# Patient Record
Sex: Female | Born: 1950 | Race: White | Hispanic: No | Smoking: Former smoker
Health system: Southern US, Community
[De-identification: ages and names within clinical notes are randomized; demographics above are authoritative.]

---

## 1997-09-20 DIAGNOSIS — Z923 Personal history of irradiation: Secondary | ICD-10-CM

## 1997-09-20 DIAGNOSIS — C50919 Malignant neoplasm of unspecified site of unspecified female breast: Secondary | ICD-10-CM

## 1997-09-20 HISTORY — DX: Malignant neoplasm of unspecified site of unspecified female breast: C50.919

## 1997-09-20 HISTORY — DX: Personal history of irradiation: Z92.3

## 1998-01-02 ENCOUNTER — Encounter: Admission: RE | Admit: 1998-01-02 | Discharge: 1998-01-02 | Payer: Self-pay | Admitting: Infectious Diseases

## 1998-05-27 ENCOUNTER — Ambulatory Visit (HOSPITAL_BASED_OUTPATIENT_CLINIC_OR_DEPARTMENT_OTHER): Admission: RE | Admit: 1998-05-27 | Discharge: 1998-05-27 | Payer: Self-pay

## 1998-05-29 ENCOUNTER — Encounter: Admission: RE | Admit: 1998-05-29 | Discharge: 1998-08-27 | Payer: Self-pay | Admitting: Radiation Oncology

## 1998-06-06 ENCOUNTER — Ambulatory Visit (HOSPITAL_COMMUNITY): Admission: RE | Admit: 1998-06-06 | Discharge: 1998-06-06 | Payer: Self-pay

## 1998-08-20 ENCOUNTER — Encounter: Admission: RE | Admit: 1998-08-20 | Discharge: 1998-11-18 | Payer: Self-pay | Admitting: Radiation Oncology

## 1999-01-08 ENCOUNTER — Other Ambulatory Visit: Admission: RE | Admit: 1999-01-08 | Discharge: 1999-01-08 | Payer: Self-pay | Admitting: Gynecology

## 2000-04-14 ENCOUNTER — Other Ambulatory Visit: Admission: RE | Admit: 2000-04-14 | Discharge: 2000-04-14 | Payer: Self-pay | Admitting: Gynecology

## 2000-08-04 ENCOUNTER — Other Ambulatory Visit: Admission: RE | Admit: 2000-08-04 | Discharge: 2000-08-04 | Payer: Self-pay | Admitting: Gynecology

## 2000-08-04 ENCOUNTER — Encounter (INDEPENDENT_AMBULATORY_CARE_PROVIDER_SITE_OTHER): Payer: Self-pay | Admitting: Specialist

## 2001-01-10 ENCOUNTER — Encounter: Payer: Self-pay | Admitting: Cardiology

## 2001-01-10 ENCOUNTER — Inpatient Hospital Stay (HOSPITAL_COMMUNITY): Admission: EM | Admit: 2001-01-10 | Discharge: 2001-01-12 | Payer: Self-pay | Admitting: Emergency Medicine

## 2001-04-06 ENCOUNTER — Encounter: Admission: RE | Admit: 2001-04-06 | Discharge: 2001-04-06 | Payer: Self-pay | Admitting: Surgery

## 2001-04-06 ENCOUNTER — Encounter: Payer: Self-pay | Admitting: Surgery

## 2003-06-06 ENCOUNTER — Other Ambulatory Visit: Admission: RE | Admit: 2003-06-06 | Discharge: 2003-06-06 | Payer: Self-pay | Admitting: Gynecology

## 2004-02-28 ENCOUNTER — Encounter: Admission: RE | Admit: 2004-02-28 | Discharge: 2004-02-28 | Payer: Self-pay | Admitting: Surgery

## 2004-06-25 ENCOUNTER — Encounter: Admission: RE | Admit: 2004-06-25 | Discharge: 2004-06-25 | Payer: Self-pay | Admitting: Gastroenterology

## 2004-06-26 ENCOUNTER — Encounter: Admission: RE | Admit: 2004-06-26 | Discharge: 2004-06-26 | Payer: Self-pay | Admitting: Gastroenterology

## 2004-06-29 ENCOUNTER — Ambulatory Visit (HOSPITAL_COMMUNITY): Admission: RE | Admit: 2004-06-29 | Discharge: 2004-06-29 | Payer: Self-pay | Admitting: Gastroenterology

## 2004-12-31 ENCOUNTER — Other Ambulatory Visit: Admission: RE | Admit: 2004-12-31 | Discharge: 2004-12-31 | Payer: Self-pay | Admitting: Gynecology

## 2006-03-08 ENCOUNTER — Encounter: Admission: RE | Admit: 2006-03-08 | Discharge: 2006-03-08 | Payer: Self-pay | Admitting: Gynecology

## 2007-01-26 ENCOUNTER — Observation Stay (HOSPITAL_COMMUNITY): Admission: EM | Admit: 2007-01-26 | Discharge: 2007-01-28 | Payer: Self-pay | Admitting: Emergency Medicine

## 2007-01-26 ENCOUNTER — Encounter (INDEPENDENT_AMBULATORY_CARE_PROVIDER_SITE_OTHER): Payer: Self-pay | Admitting: Specialist

## 2007-04-18 ENCOUNTER — Encounter: Admission: RE | Admit: 2007-04-18 | Discharge: 2007-04-18 | Payer: Self-pay | Admitting: Surgery

## 2007-04-19 ENCOUNTER — Ambulatory Visit (HOSPITAL_COMMUNITY): Admission: RE | Admit: 2007-04-19 | Discharge: 2007-04-19 | Payer: Self-pay | Admitting: Gastroenterology

## 2007-05-30 ENCOUNTER — Ambulatory Visit (HOSPITAL_BASED_OUTPATIENT_CLINIC_OR_DEPARTMENT_OTHER): Admission: RE | Admit: 2007-05-30 | Discharge: 2007-05-30 | Payer: Self-pay | Admitting: Surgery

## 2007-05-30 ENCOUNTER — Encounter (INDEPENDENT_AMBULATORY_CARE_PROVIDER_SITE_OTHER): Payer: Self-pay | Admitting: Surgery

## 2008-07-04 ENCOUNTER — Encounter: Admission: RE | Admit: 2008-07-04 | Discharge: 2008-07-04 | Payer: Self-pay | Admitting: Surgery

## 2009-07-08 ENCOUNTER — Encounter: Admission: RE | Admit: 2009-07-08 | Discharge: 2009-07-08 | Payer: Self-pay | Admitting: Gynecology

## 2011-02-02 NOTE — H&P (Signed)
NAMEBILAN, TEDESCO NO.:  [id number]   MEDICAL RECORD NO.:  [id number]          PATIENT TYPE:  EMS   LOCATION:  MAJO                         FACILITY:  [facility]   PHYSICIAN:  Adolph Pollack, M.D.DATE OF BIRTH:  1951-02-24   DATE OF ADMISSION:  01/26/2007  DATE OF DISCHARGE:                              HISTORY & PHYSICAL   CHIEF COMPLAINT:  Abdominal pain.   HISTORY OF PRESENT ILLNESS:  Ms. Sibrian is a 60 year old female who after  supper about 7 o'clock last night developed some significant what she  describes as some sharp centralized abdominal pain that progressively  worsened.  It eventually migrated to the right lower quadrant.  She  states she had been treated for a urinary tract infection about the past  day and a half.  She presented to the emergency department at 1 o'clock  this morning for evaluation.  She was examined at that time, and then a  CT scan was ordered.  This demonstrated appendicitis with some tiny  locules of free air which suggests perforation.  Some hepatic cysts were  noted.  At this time, I was asked to see her.  She says she has had some  fever and nausea.   PAST MEDICAL HISTORY:  1. Right breast cancer, status post breast conservation radiation      treatment.  2. Coronary spasm.  3. Hypertension.  4. UTI.  5. Positive PPD.   PREVIOUS OPERATIONS:  1. Right partial mastectomy and a sentinel lymph node biopsy.  2. Left femoral hernia repair.   ALLERGIES:  None.   MEDICATIONS:  1. Norvasc.  2. Septra.   SOCIAL HISTORY:  She is a former smoker; she quit about 30 years ago.  No alcohol use.  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   REVIEW OF SYSTEMS:  CARDIAC:  Other than the coronary spasm, she denies  any cardiac disease.  PULMONARY:  No asthma, COPD, tuberculosis.  GI: No  hepatitis, peptic ulcer disease, diverticulitis.  GU:  No kidney stones.  ENDOCRINE:  No diabetes, thyroid  disease, or hypercholesterolemia.  NEUROLOGIC:  No strokes or seizures.  HEMATOLOGIC:  No bleeding  disorders, transfusions or blood clots.   PHYSICAL EXAMINATION:  GENERAL:  A slightly ill-appearing female, but in  no acute distress at this time.  VITAL SIGNS:  Temperature is 0.1, blood pressure is 114/62, pulse 88.  HEENT:  Eyes - Extraocular motions intact.  No icterus.  NECK:  Supple without obvious masses.  RESPIRATORY:  Breath sounds are equal and clear.  Respirations are  unlabored.  CARDIOVASCULAR:  Regular rate and regular rhythm.  I do not hear a  murmur.  No JVD or lower extremity edema.  ABDOMEN:  Soft.  There is right lower quadrant tenderness and guarding.  Negative Rovsing's sign.  Positive obturator sign.  No palpable masses.  GU:  There is a left groin scar present.  EXTREMITIES:  Good muscle tone.  No edema.  SKIN:  No jaundice.  NEUROLOGIC:  Alert and oriented, answers questions appropriately.  LABORATORY DATA:  White blood cell count 12,300, hemoglobin 12.2.  Electrolytes within normal limits except for a potassium of 3.4.  CT  scan was reviewed.  This demonstrates a dilated appendix with some  periappendiceal inflammatory change.  A small amount of free fluid in  the pelvis.  There are some tiny locules of intraperitoneal air noted.   IMPRESSION:  Acute appendicitis with probable perforation.   PLAN:  IV antibiotics.  Laparoscopic, possible open appendectomy.  I  have discussed the procedure and the risks with her.  Risks include but  are not limited to bleeding, infection, wound healing problems,  anesthesia, accidental damage to abdominal organs, and prolonged  hospital course.  She seems to understand this and agrees to proceed.      Adolph Pollack, M.D.  Electronically Signed     TJR/MEDQ  D:  01/26/2007  T:  01/26/2007  Job:  [id number]

## 2011-02-02 NOTE — Op Note (Signed)
Deanna Reeves, Deanna Reeves                ACCOUNT NO.:  [id number]   MEDICAL RECORD NO.:  [id number]          PATIENT TYPE:  AMB   LOCATION:  DSC                          FACILITY:  [facility]   PHYSICIAN:  Currie Paris, M.D.DATE OF BIRTH:  [birth year]   DATE OF PROCEDURE:  05/30/2007  DATE OF DISCHARGE:                               OPERATIVE REPORT   OFFICE MEDICAL RECORD NUMBER [id number].   PREOPERATIVE DIAGNOSIS:  Tiny nodule, left breast, probably dermal.   POSTOPERATIVE DIAGNOSIS:  Tiny nodule, left breast, probably dermal.   OPERATION:  Removal, left breast dermal mass.   SURGEON:  Currie Paris, M.D.   ANESTHESIA:  Local.   CLINICAL HISTORY:  Ms. Castor has had a history of a right breast cancer  and has a small mass in the left breast that feels dermal or just subcu  that is about 2 mm in size.  With her given prior history, we elected to  remove this.   DESCRIPTION OF PROCEDURE:  The patient was seen in the minor procedure  room and we confirmed the plans for the procedure and identified the  tiny nodule, and I was able to make a small ink mark directly on the  nodule.   The area was then anesthetized with 1% Xylocaine with epinephrine.  I  waited approximately 10 minutes for good effect of the epinephrine.  The  area was prepped with Betadine.  I made an elliptical incision taking  off a small skin dot as well as the tiny nodule, which seemed attached  to the undersurface of the skin although I could not see a punctum on  the surface.   There was minimal bleeding.  The incision was closed with a 4-0 Monocryl  subcuticular and Dermabond.  The patient tolerated the procedure well  and there no complications.      Currie Paris, M.D.  Electronically Signed     CJS/MEDQ  D:  05/30/2007  T:  05/30/2007  Job:  [id number]

## 2011-02-02 NOTE — Op Note (Signed)
NAMEVIANCA, Deanna Reeves                ACCOUNT NO.:  [id number]   MEDICAL RECORD NO.:  [id number]          PATIENT TYPE:  INP   LOCATION:  2550                         FACILITY:  [facility]   PHYSICIAN:  Leonie Man, M.D.   DATE OF BIRTH:  [birth year]   DATE OF PROCEDURE:  01/26/2007  DATE OF DISCHARGE:                               OPERATIVE REPORT   PREOPERATIVE DIAGNOSIS:  Acute appendicitis.   POSTOPERATIVE DIAGNOSIS:  Acute appendicitis.   PROCEDURE:  Laparoscopic appendectomy.   SURGEON:  Leonie Man, M.D.   ASSISTANT:  OR tech.   ANESTHESIA:  General.   SPECIMENS TO LABORATORY:  Appendix.   ESTIMATED BLOOD LOSS:  Minimal.   COMPLICATIONS:  None.  The patient returned to the PACU in excellent  condition.   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  The pain worsened and migrated to the right lower quadrant.  She comes  in without a h/o fever, nausea or vomiting.  CT scan of the abdomen  demonstrates acute appendicitis.   The patient is brought to the operating room after the risks and  potential benefits of surgery have been discussed with her, all  questions answered and her consent is obtained for surgery.   DESCRIPTION OF PROCEDURE:  Following induction of satisfactory general  anesthesia, the patient was positioned supinely.  The abdomen was  prepped and draped to be included in a sterile operative field.  Open  laparoscopy entry was created at the umbilicus and a Hasson cannula was  inserted.  The peritoneum was insufflated to 15 mmHg pressure.  The  visualized appendix was partially retrocecal and had to be dissected up  from the retrocecal area. A 5-mm trocar was placed at the suprapubic  area and a 10/12-mm trocar was placed in the left lower quadrant.  The  appendix was grasped and dissected from its retrocecal position. The  harmonic scalpel was then used  to divide the mesoappendix. The  dissection was continued to base the appendix.  The appendix was  transected with an Endo-GIA stapling device..  The appendix was then  placed in an Endopouch and retrieved through the left lower quadrant  wound without difficulty.  The right lower quadrant was then thoroughly  irrigated with multiple aliquots of normal saline. The cecal suture line  was inspected for hemostasis and noted to be dry.  The suture line  appeared to be intact.  The saline was aspirated out.  The  pneumoperitoneum was allowed to deflate after the trocars were removed  under direct vision and the wounds closed in layers as follows:  The  umbilical wound in 2 layers with 0  Vicryl and 4-0 Monocryl; the left lower quadrant and suprapubic wounds  were closed with 4-0 Monocryl sutures.  All wounds were reinforced with  Steri-Strips.  Sterile dressings were applied, the anesthetic was  reversed and the patient removed from the operating room to the recovery  room in stable condition.  She tolerated the procedure well.  Leonie Man, M.D.  Electronically Signed     PB/MEDQ  D:  01/26/2007  T:  01/26/2007  Job:  [id number]

## 2011-02-05 NOTE — Cardiovascular Report (Signed)
[place]  Patient:    Deanna Reeves, Deanna Reeves                       MRN: [id number] Proc. Date: 01/10/01 Adm. Date:  [id number] Attending:  Ophelia Shoulder CC:         Cardiac Catheterization Laboratory  [facility], c/o Dr. Elsie Lincoln   Cardiac Catheterization  PROCEDURE PERFORMED:  Emergency combined left heart catheterization.  INDICATIONS:  Acute chest pain and ST segment elevation showing inferior and anterolateral wall injury pattern.  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  It occurred at rest.  She visited the Urgent Care Center in [place] and there had ST segment elevations in the inferior and anterolateral leads.  She was brought to the [facility] Emergency Room by [facility] and en route received sublingual nitroglycerin twice. When she arrived here, a repeat ECG showed complete resolution of the previously seen ECG, ST elevations.  She was pain-free.  Vital signs were stable.  Labs were drawn and she was brought to the catheterization lab emergently.  The patient has no knowledge of any heart disease in the past.  Risk factors are only related to mild labile elevated blood pressure.  RESULTS:  PRESSURES:  The left ventricular pressure was 127/17, the central aortic pressure 125/75, mean of 100.  No aortic valve gradient by pullback technique.  The left main coronary artery was large and angiographically patent.  The LAD coursed to the cardiac apex giving rise to three diagonal branches and three septal perforator branches.  After the third diagonal and third septal perforator, there was a smooth irregularity in the LAD, which amounted to no more than 20% of luminal  reduction of the surrounding LAD.  The diagonal branches were normal.  The circumflex coronary artery was nondominant but was very large and basically terminated as a posterolateral branch.  Just after the takeoff of a small to medium sized ramus intermedius, the circumflex showed eccentric kinking in some views.  However, no obstructive lesion was seen there. The right coronary artery was large and dominant.  No lesions were seen. There was streaming of dye.  No thrombus was noted.  The left ventricle contracted vigorously and normally.  Her calculated ejection fraction was 68%.  I did not see any obvious mitral prolapse and there was no mitral regurgitation.  There was also no LV thrombus.  ABDOMINAL AORTOGRAM:  Abdominal showed normal renal arteries and abdominal aorta and common iliacs.  FINAL DIAGNOSES: 1. Angiographically patent coronary arteries. 2. Clinical suspicion of reversible irregularities and proximal circumflex    which probably constitutes spasm. 3. Normal left ventricular systolic function.  DISCUSSION:  The patients clinical course is that of coronary spasm.  I am suspicious that it is related to a vasospastic process involving the proximal circumflex.  The patient will be continued on IV nitroglycerin, oral platelet inhibitors and oral calcium channel blocker in the form of Norvasc.  We will weigh her cardiac enzymes. DD:  01/10/01 TD:  01/11/01 Job: [id number]

## 2011-02-05 NOTE — Discharge Summary (Signed)
[place]  Patient:    Deanna Reeves, Deanna Reeves                       MRN: [id number] Adm. Date:  [id number] Disc. Date: [id number] Attending:  Ophelia Shoulder Dictator:   [facility] [place], [place].A.                           Discharge Summary  ADMITTING DIAGNOSES: 1. Unstable angina, questionable spasm versus coronary artery disease    etiology. 2. Hypertension. 3. History of breast cancer with lumpectomy and radiation therapy.  DISCHARGE DIAGNOSES: 1. Acute coronary vasospasm. 2. Hypertension. 3. Status post cardiac catheterization, January 10, 2001 by Dr. Lavonne Chick    revealing no significant coronary artery disease, but positive coronary    vasospasm. 4. History of breast cancer with lumpectomy and radiation therapy.  HISTORY OF PRESENT ILLNESS:  Deanna Reeves is a 60 year old white divorced white female with no prior cardiac history and past medical history only significant for mildly elevated blood pressures and history of breast cancer.  She presented to [facility] ER on January 10, 2001 with complaints of chest discomfort.  She developed significant upper chest burning earlier that day, but had never had this type of discomfort before.  She started feeling "bad in general," and took regular aspirin and went to the [facility]. There the EKG revealed ST elevation in 2, 3, aVF as well as V4-V6. She was given 1 nitroglycerin with improvement.  Second nitroglycerin in the EMS had brought complete resolution of the pain.  She had no associated symptoms.  At [facility] in the emergency room she was having no chest pain at that time and her EKG was improved; and, she was on IV heparin and nitroglycerin, and was also given Plavix and was taken to the cardiac cath lab emergently.  On exam in the ER her blood pressure was elevated at 160/100, heart rate was 74 and the remainder of her exam was benign.  EKG #1 had showed ST elevation in lead 2, 3,  aVF and V4-V6.  EKG #2 showed sinus rhythm in LAD.  At that poiny laboratories were pending as well as chest x-ray.  HOSPITAL COURSE:  On January 10, 2001 Deanna Reeves was taken to the cardiac catheterization lab emergently by Dr. Lavonne Chick.  While in the cath lab her EKG changed multiple times in leads 1, 2 and 3 with ST elevation transiently in lead 1 during cath.  T inversions in lead 3 reverting to baseline was within minutes.  She had mild chest pain recurrence in the lab.  Though her blood pressure had been elevated in the ER at 160/100 in the cath lab it was down to 130/76.  Cardiac catheterization was performed via the right groin. Her right coronary artery was dominant, but showed no lesions.  The LAD had some mid smooth 20-30% irregularity.  The left circumflex had proximal eccentric spasm.  Her LV was normal.  Abdominal aorta and renals were normal.  With these cardiac cath findings Dr. Elsie Lincoln planned to await cardiac enzymes, check EKGs during any further pain.  He was planned Plavix, aspirin and was planned for IV nitroglycerin overnight, and for oral Norvasc therapy.  On January 11, 2001 Deanna Reeves remained stable.  Her cardiac enzymes were positive, but the third set was coming down.  Her CKs were 127, 339 and 258. MBs  were 8.6, 4.3 and 2.2.  Troponin was 0.25 and 8.62 at that point.  TSH was normal.  She remained afebrile and remained hemodynamically stable.  Her right groin was stable with no hematoma and she had a 3+ dorsalis pedis pulse.  Her chest pain was gone, but she continued to have a nitroglycerin induced headache.  At that point Dr. Elsie Lincoln planned to wean her off the IV nitroglycerin and continue Norvasc for blood pressure and spasm prophylaxis. He planned to monitor her for one more day and planned for discharge home the following morning if all went well.  On January 12, 2001 Deanna Reeves is subjectively well.  She continues to have a nitrate induced headache, but no  further chest discomfort.  Her pulse was 76 and blood pressure was stable.  Her lungs were clear.  Heart was in regular rhythm without murmur.  Her groin was stable with no hematoma.  She was seen by Dr. Lavonne Chick at this time who has deemed her stable for discharge home.  HOSPITAL CONSULTS:  None.  HOSPITAL PROCEDURES:  Cardiac catheterization on January 10, 2001 by Dr. Lavonne Chick.  This revealed RCA to be dominant, but with no lesions.  LAD had mid smooth 20-30% irregularity. Left circumflex had proximal eccentric spasm.  LV was normal.  Abdominal aorta and renals were normal.  HOSPITAL LABORATORIES:  First set of enzymes showed CK of 127, MB 8.6 and troponin 0.25.  Second set of cardiac enzymes showed CK 339, MB 43.6 and troponin 8.62.  Third set showed CK 258, MB 22.2.  Lipid profile revealed total cholesterol 124, triglycerides 51, HDL 58 and LDL 56.  On April 23rd sodium 134, potassium 4.0, glucose 110, BUN 11, and creatinine 0.7.  LFTs normal with total bili 0.8, alk phos 57, SGOT 25, SGPT 18.  CBC at that time showed WBC 8.4, hemoglobin 13.5, hematocrit 37.9 and platelets 248,000.  On admission PT 13.3, INR 1.1 and PTT 29.  TSH normal at 1.421.  On April 24th labs remained stable with sodium 138, potassium 3.4, which was repleted, glucose 80, BUN 8, and creatinine 0.8.  CBC at that time showed WBC 10.2, hemoglobin 12.6, hematocrit 36.3 and platelets 224,000.  Radiology:  Chest x-ray showed borderline cardiac size, no active chest disease radiographically.  EKG:  Upon arrival to the emergency room her initial EKG showed sinus tachycardia with sinus rhythm with ST elevations in leads 2, 3, aVF as well as V4 through V6.  Second EKG in the ER showed sinus rhythm, LAD.  While in the cath lab her EKG changed multiple times in leads 1, 2 and 3 with ST elevation transiently in lead 1 during cath.  T inversions in lead 3 reverting to baseline within minutes.  DISCHARGE  MEDICATIONS: 1. Enteric coated aspirin 325 mg once a day. 2. Plavix 75 mg once a day. 3. Norvasc 5 mg once a day. 4. Protonix 40 mg once a day.  5. Nitroglycerin 0.4 mg as directed.  ACTIVITIES:  No strenuous activity, lifting greater than five pounds, driving or sexual activity for three days.  DIET:  Low-salt, low-fat, low-cholesterol diet.  WOUND CARE:  Gently wash her groin with warm water and soap.  SPECIAL INSTRUCTIONS:  Call the office at [phone] if any bleeding or increased size or pain in the groin.   Out of work until May 06th.  Have blood drawn on May 06th, will check BMET.  FOLLOW-UP:  She is scheduled for an exercise Cardiolite  at our office Tuesday, April 30th at 8:15 A.M.  She has an appointment to follow up with Dr. Elsie Lincoln in the office Thursday, May 02nd, at 3:40 P.M. DD:  01/12/01 TD:  01/13/01 Job: [id number]

## 2011-02-05 NOTE — Op Note (Signed)
[name], [name]                ACCOUNT NO.:  [id number]   MEDICAL RECORD NO.:  [id number]          PATIENT TYPE:  AMB   LOCATION:  ENDO                         FACILITY:  [facility]   PHYSICIAN:  Anselmo Rod, M.D.  DATE OF BIRTH:  July 31, 1951   DATE OF PROCEDURE:  06/29/2004  DATE OF DISCHARGE:                                 OPERATIVE REPORT   PROCEDURE PERFORMED:  Colonoscopy with snare polypectomy times one.   ENDOSCOPIST:  Charna Elizabeth, M.D.   INSTRUMENT USED:  Olympus video colonoscope.   INDICATIONS FOR PROCEDURE:  The patient is a 60 year old white female with a  personal history of breast cancer undergoing a screening colonoscopy to rule  out colonic polyps, masses, etc.   PREPROCEDURE PREPARATION:  Informed consent was procured from the patient.  The patient was fasted for eight hours prior to the procedure and prepped  with a bottle of magnesium citrate and a gallon of GoLYTELY the night prior  to the procedure.   PREPROCEDURE PHYSICAL:  The patient had stable vital signs.  Neck supple.  Chest clear to auscultation.  S1 and S2 regular.  Abdomen soft with normal  bowel sounds.   DESCRIPTION OF PROCEDURE:  The patient was placed in left lateral decubitus  position and sedated with 60 mg of Demerol and 6 mg of Versed in slow  incremental doses.  Once the patient was adequately sedated and maintained  on low flow oxygen and continuous cardiac monitoring, the Olympus video  colonoscope was advanced from the rectum to the cecum with difficulty.  There was a large amount of residual stool in the colon.  Multiple washes  were done.  The appendicular orifice and ileocecal valve were clearly  visualized and photographed.  A small sessile polyp was snared from 20 cm  but was loss in stool.  No masses, polyps, erosions, ulcerations, etc. were  identified besides the one mentioned above.  Retroflexion in the rectum  revealed no abnormalities.  There was no evidence of  diverticulosis.  The  patient tolerated the procedure well without complication.  Small lesions  could have been missed.   IMPRESSION:  1.  Small sessile polyp snared from 20 cm.  Polyp loss in stool.  2.  No evidence of diverticulosis.  3.  No large masses or polyps seen.  4.  Large amount of residual stool in the colon, small lesions could have      been missed.   RECOMMENDATIONS:  1.  Continue high fiber diet with liberal fluid intake.  2.  Repeat colorectal cancer screening in the next three years unless the      patient develops any abnormal symptoms in the interim.  3.  Outpatient followup as need arises in the future.       JNM/MEDQ  D:  06/29/2004  T:  06/29/2004  Job:  [id number]   cc:   Madaline Savage, M.D.  [address]  [place]  [place] [address]  Fax: [phone]   Luvenia Redden, M.D.  [address]  [place]  [place] [address]  Fax: [phone]   Currie Paris, M.D.  [address], [address]  [place]  [place] [address]  Fax: [phone]

## 2019-10-12 ENCOUNTER — Ambulatory Visit: Payer: Medicare Other | Attending: Internal Medicine

## 2019-10-12 DIAGNOSIS — Z23 Encounter for immunization: Secondary | ICD-10-CM

## 2019-10-12 NOTE — Progress Notes (Signed)
   Covid-19 Vaccination Clinic  Name:  MUNEERA WEGENER    MRN: [id number] DOB: [birth year]  10/12/2019  Ms. Bisson was observed post Covid-19 immunization for 15 minutes without incidence. She was provided with Vaccine Information Sheet and instruction to access the V-Safe system.   Ms. Kinnison was instructed to call 911 with any severe reactions post vaccine: Marland Kitchen Difficulty breathing  . Swelling of your face and throat  . A fast heartbeat  . A bad rash all over your body  . Dizziness and weakness    Immunizations Administered    Name Date Dose VIS Date Route   Pfizer COVID-19 Vaccine 10/12/2019  5:22 PM 0.3 mL 08/31/2019 Intramuscular   Manufacturer: [place]   Lot: [id number]   [place]: [id number]

## 2019-11-02 ENCOUNTER — Ambulatory Visit: Payer: Federal, State, Local not specified - PPO | Attending: Internal Medicine

## 2019-11-02 DIAGNOSIS — Z23 Encounter for immunization: Secondary | ICD-10-CM | POA: Insufficient documentation

## 2019-11-02 NOTE — Progress Notes (Signed)
   Covid-19 Vaccination Clinic  Name:  Deanna Reeves    MRN: [id number] DOB: 08-06-1951  11/02/2019  Deanna Reeves was observed post Covid-19 immunization for 15 minutes without incidence. She was provided with Vaccine Information Sheet and instruction to access the V-Safe system.   Deanna Reeves was instructed to call 911 with any severe reactions post vaccine: Marland Kitchen Difficulty breathing  . Swelling of your face and throat  . A fast heartbeat  . A bad rash all over your body  . Dizziness and weakness    Immunizations Administered    Name Date Dose VIS Date Route   Pfizer COVID-19 Vaccine 11/02/2019  3:59 PM 0.3 mL 08/31/2019 Intramuscular   Manufacturer: [place]   Lot: [id number]   [place]: [id number]

## 2020-10-27 ENCOUNTER — Other Ambulatory Visit: Payer: Self-pay | Admitting: Family Medicine

## 2020-10-27 DIAGNOSIS — E2839 Other primary ovarian failure: Secondary | ICD-10-CM

## 2020-11-05 ENCOUNTER — Other Ambulatory Visit: Payer: Self-pay | Admitting: Family Medicine

## 2020-11-05 DIAGNOSIS — Z1231 Encounter for screening mammogram for malignant neoplasm of breast: Secondary | ICD-10-CM

## 2020-12-25 ENCOUNTER — Ambulatory Visit
Admission: RE | Admit: 2020-12-25 | Discharge: 2020-12-25 | Disposition: A | Discharge status: Home / Self Care | Payer: Medicare Other | Source: Ambulatory Visit | Attending: Family Medicine | Admitting: Family Medicine

## 2020-12-25 ENCOUNTER — Other Ambulatory Visit: Payer: Self-pay

## 2020-12-25 DIAGNOSIS — Z1231 Encounter for screening mammogram for malignant neoplasm of breast: Secondary | ICD-10-CM

## 2021-04-09 ENCOUNTER — Other Ambulatory Visit: Payer: Self-pay

## 2021-04-09 ENCOUNTER — Ambulatory Visit
Admission: RE | Admit: 2021-04-09 | Discharge: 2021-04-09 | Disposition: A | Discharge status: Home / Self Care | Payer: Medicare Other | Source: Ambulatory Visit | Attending: Family Medicine | Admitting: Family Medicine

## 2021-04-09 DIAGNOSIS — E2839 Other primary ovarian failure: Secondary | ICD-10-CM

## 2022-10-11 IMAGING — MG MM DIGITAL SCREENING BILAT W/ TOMO AND CAD
6 of 10 series · 6 of 30 positions shown · non-contrast
Comparison: Previous exam(s).

CLINICAL DATA: Screening.

EXAM:
DIGITAL SCREENING BILATERAL MAMMOGRAM WITH TOMOSYNTHESIS AND CAD
TECHNIQUE: Bilateral screening digital craniocaudal and mediolateral oblique
mammograms were obtained. Bilateral screening digital breast
tomosynthesis was performed. The images were evaluated with
computer-aided detection.

[L CC synth-2D]
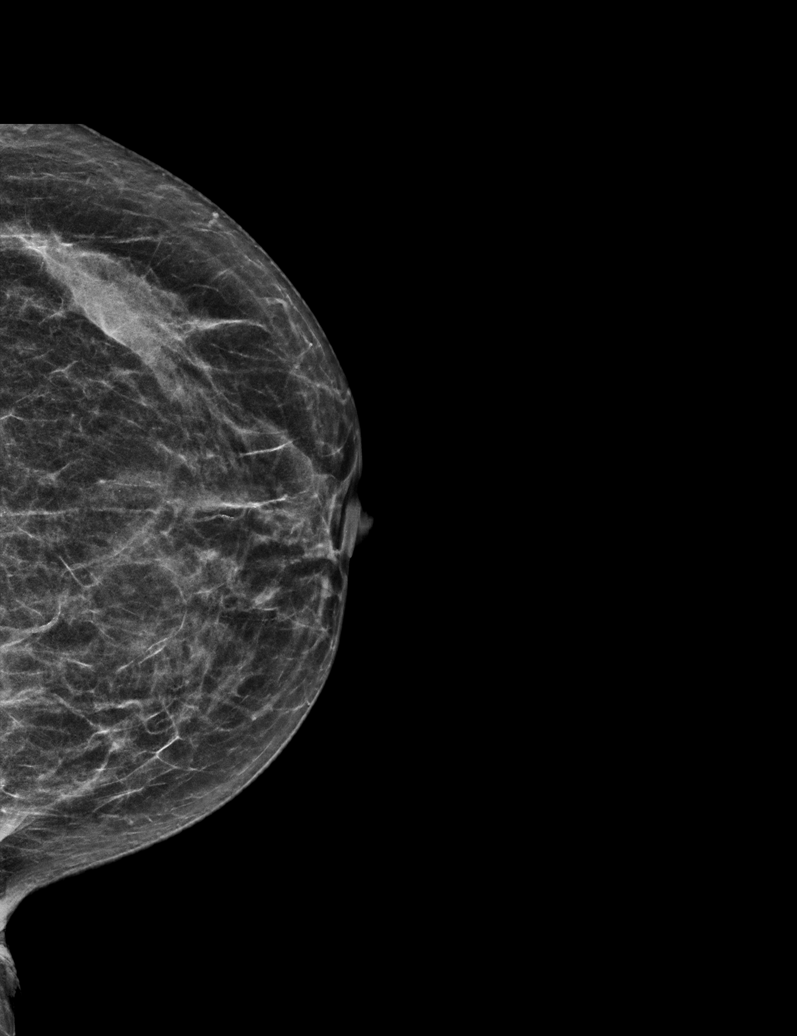

[L MLO synth-2D]
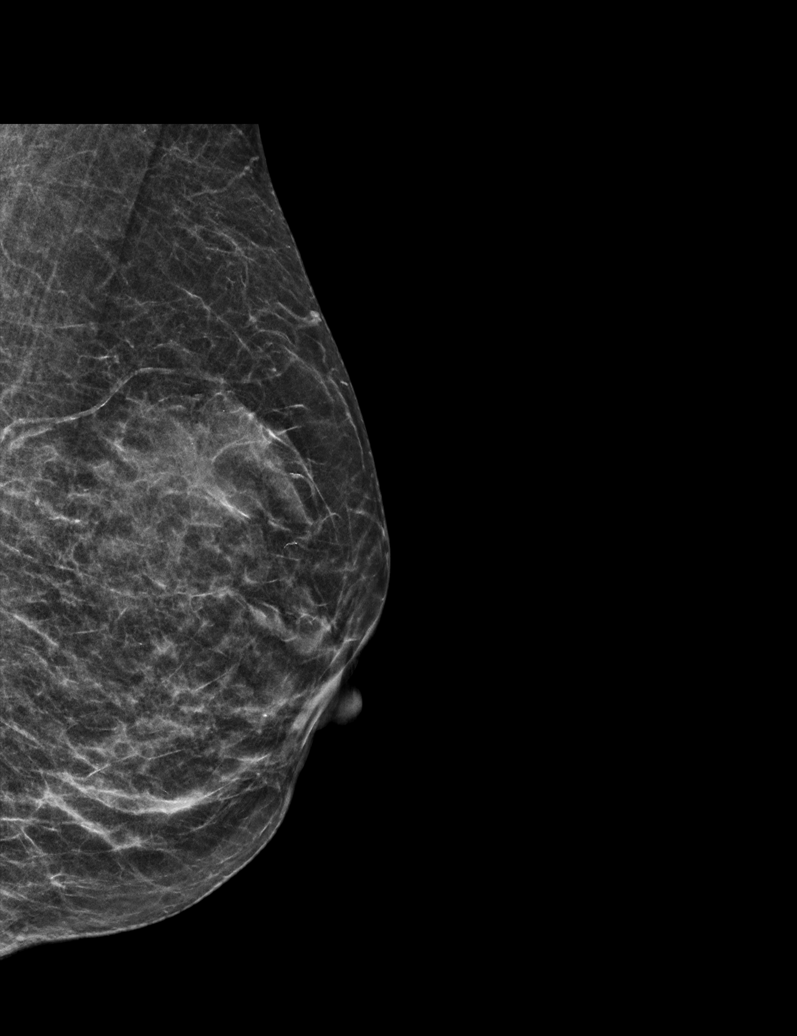

[R MLO synth-2D]
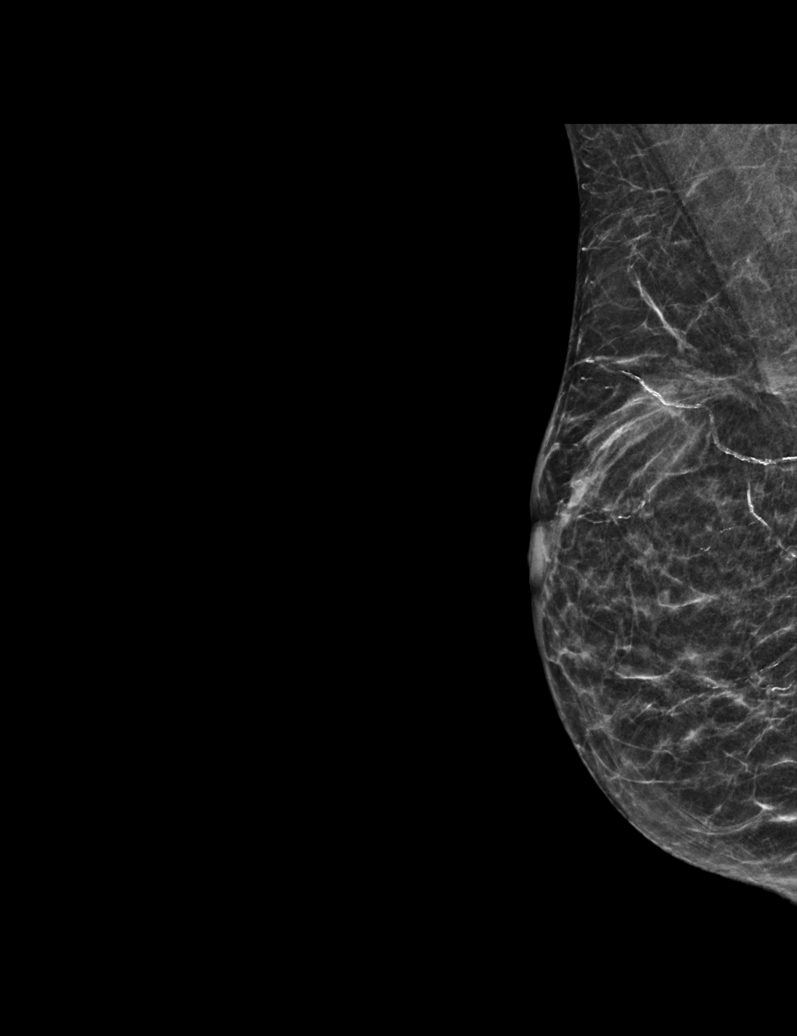

[R XCCL synth-2D]
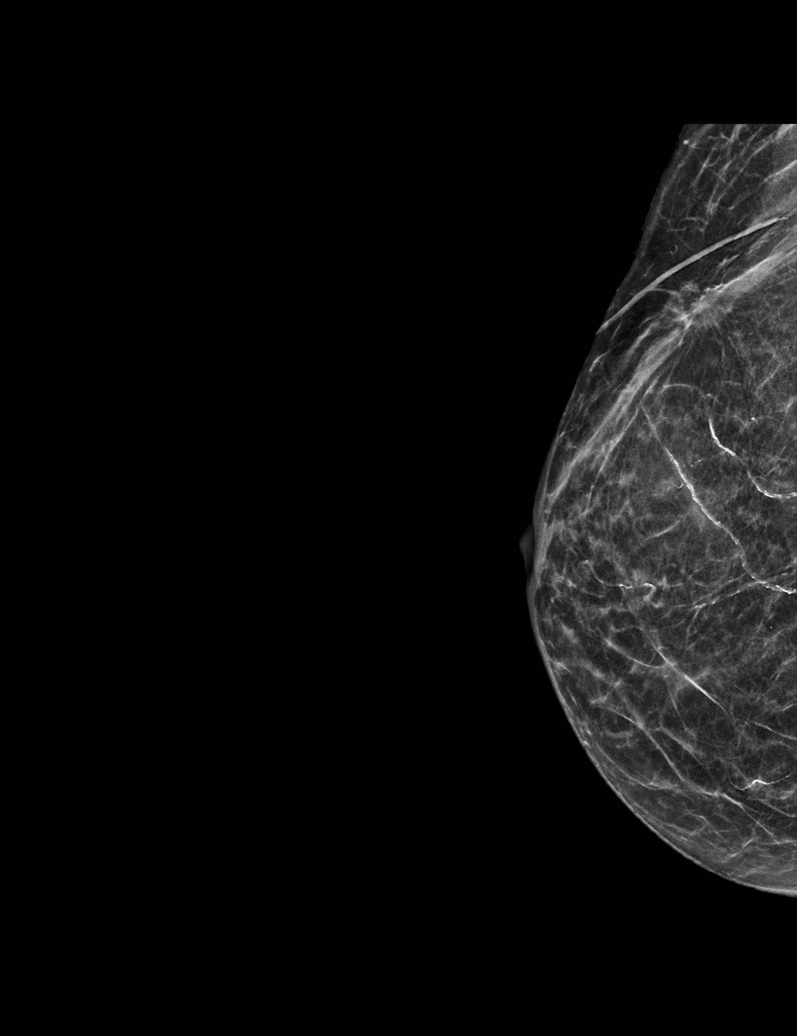

[R CC synth-2D]
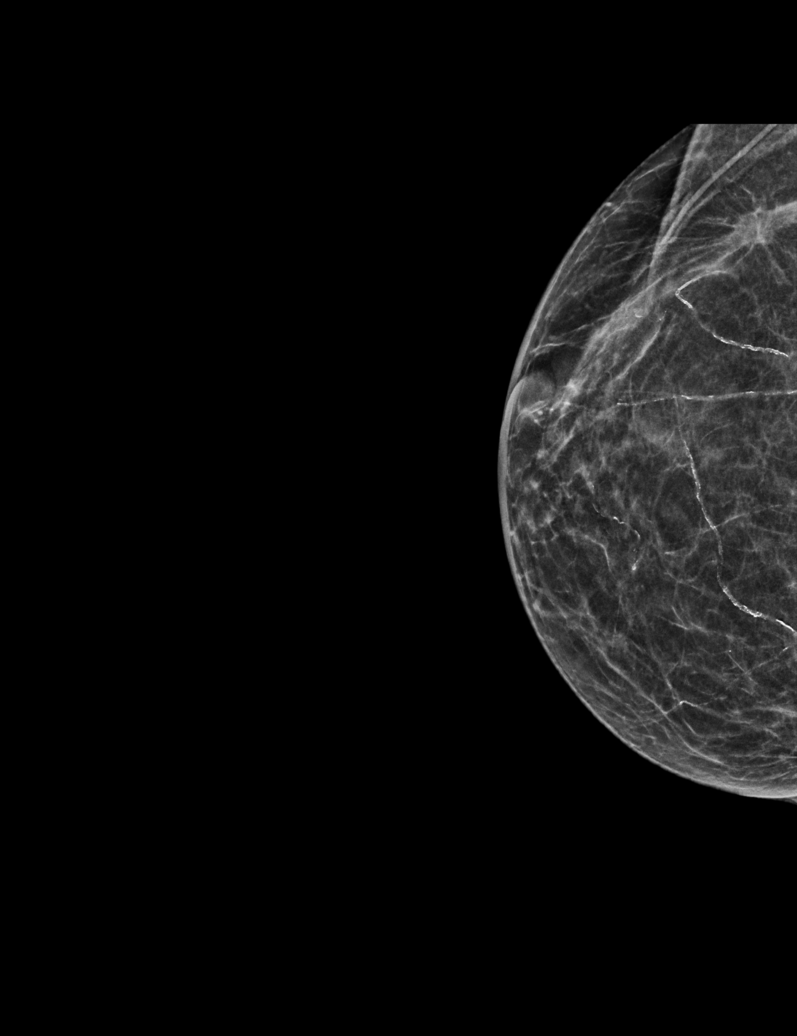

[R CC tomo · tomo slice 21/40.0]
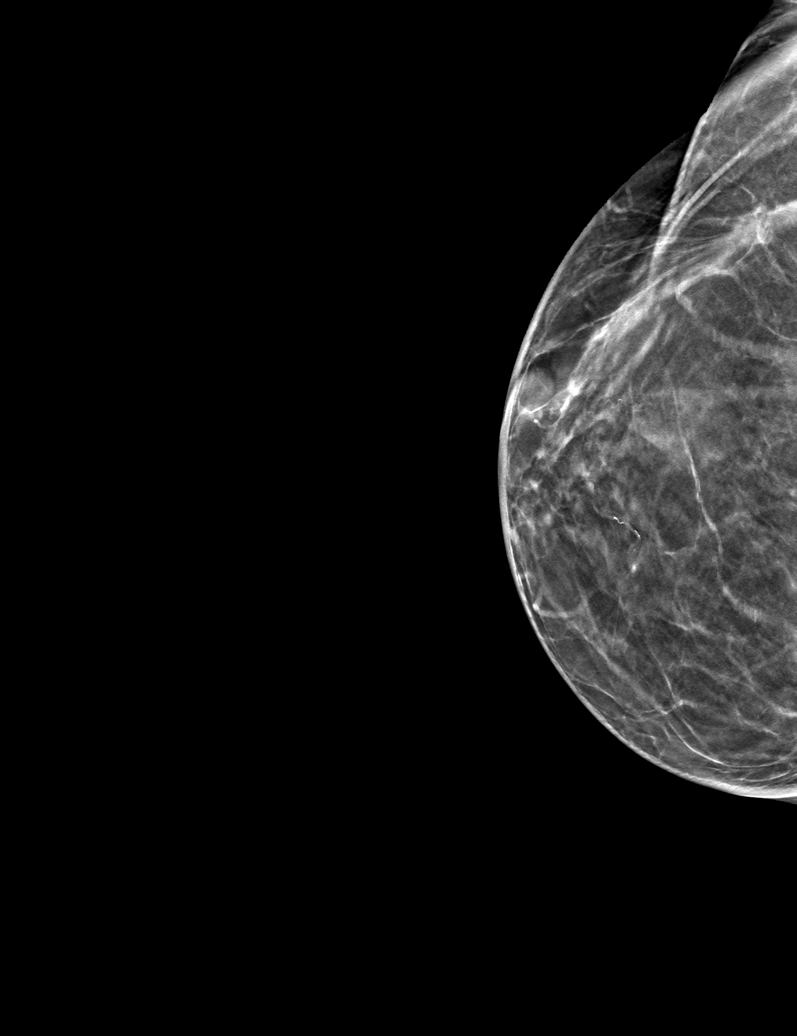

[6 of 30 positions shown; findings below may reference images not displayed]

ACR Breast Density Category b: There are scattered areas of
fibroglandular density.
FINDINGS: There are no findings suspicious for malignancy. The images were
evaluated with computer-aided detection.
IMPRESSION: No mammographic evidence of malignancy. A result letter of this
screening mammogram will be mailed directly to the patient.

RECOMMENDATION:
Screening mammogram in one year. (Code:[id number])

BI-RADS CATEGORY  1: Negative.

## 2022-11-12 ENCOUNTER — Other Ambulatory Visit (HOSPITAL_COMMUNITY): Payer: Self-pay | Admitting: Family Medicine

## 2022-11-12 DIAGNOSIS — Z853 Personal history of malignant neoplasm of breast: Secondary | ICD-10-CM

## 2022-11-12 DIAGNOSIS — R0602 Shortness of breath: Secondary | ICD-10-CM

## 2022-11-26 ENCOUNTER — Encounter (HOSPITAL_COMMUNITY): Payer: Self-pay | Admitting: Radiology

## 2022-11-26 ENCOUNTER — Ambulatory Visit (HOSPITAL_COMMUNITY)
Admission: RE | Admit: 2022-11-26 | Discharge: 2022-11-26 | Disposition: A | Discharge status: Home / Self Care | Payer: Medicare Other | Source: Ambulatory Visit | Attending: Family Medicine | Admitting: Family Medicine

## 2022-11-26 DIAGNOSIS — Z853 Personal history of malignant neoplasm of breast: Secondary | ICD-10-CM | POA: Diagnosis present

## 2022-11-26 DIAGNOSIS — R0602 Shortness of breath: Secondary | ICD-10-CM | POA: Diagnosis present

## 2022-11-26 MED ORDER — IOHEXOL 300 MG/ML  SOLN
75.0000 mL | Freq: Once | INTRAMUSCULAR | Status: AC | PRN
  Administered 2022-11-26: 75 mL via INTRAVENOUS

## 2022-11-26 MED ORDER — SODIUM CHLORIDE (PF) 0.9 % IJ SOLN
INTRAMUSCULAR | Status: AC
  Filled 2022-11-26: qty 50

## 2022-12-02 ENCOUNTER — Telehealth: Payer: Self-pay

## 2022-12-02 NOTE — Telephone Encounter (Signed)
Spoke with pt regarding her referral to see cardiology after her recent CTA chest ordered by her PCP. Office visit scheduled. Pt verbalizes understanding.

## 2022-12-03 ENCOUNTER — Ambulatory Visit: Payer: Medicare Other | Attending: Cardiovascular Disease | Admitting: Cardiovascular Disease

## 2022-12-03 ENCOUNTER — Encounter: Payer: Self-pay | Admitting: Cardiovascular Disease

## 2022-12-03 VITALS — BP 127/86 | HR 83 | Ht 66.0 in | Wt 154.2 lb

## 2022-12-03 DIAGNOSIS — I7121 Aneurysm of the ascending aorta, without rupture: Secondary | ICD-10-CM | POA: Diagnosis present

## 2022-12-03 DIAGNOSIS — I10 Essential (primary) hypertension: Secondary | ICD-10-CM | POA: Insufficient documentation

## 2022-12-03 DIAGNOSIS — I201 Angina pectoris with documented spasm: Secondary | ICD-10-CM | POA: Insufficient documentation

## 2022-12-03 DIAGNOSIS — R072 Precordial pain: Secondary | ICD-10-CM | POA: Diagnosis present

## 2022-12-03 DIAGNOSIS — I712 Thoracic aortic aneurysm, without rupture, unspecified: Secondary | ICD-10-CM | POA: Insufficient documentation

## 2022-12-03 DIAGNOSIS — Z1589 Genetic susceptibility to other disease: Secondary | ICD-10-CM | POA: Insufficient documentation

## 2022-12-03 MED ORDER — METOPROLOL TARTRATE 100 MG PO TABS: 100.0000 mg | ORAL_TABLET | Freq: Once | ORAL | 0 refills | Status: DC

## 2022-12-03 MED ORDER — METOPROLOL SUCCINATE ER 25 MG PO TB24: 12.5000 mg | ORAL_TABLET | Freq: Every day | ORAL | 3 refills | Status: AC

## 2022-12-03 NOTE — Patient Instructions (Addendum)
Medication Instructions:  Your physician has recommended you make the following change in your medication:   -Start metoprolol succinate (Toprol-XL) 12.5mg  once daily.  *If you need a refill on your cardiac medications before your next appointment, please call your pharmacy*   Testing/Procedures: Your physician has requested that you have an echocardiogram. Echocardiography is a painless test that uses sound waves to create images of your heart. It provides your doctor with information about the size and shape of your heart and how well your heart's chambers and valves are working. This procedure takes approximately one hour. There are no restrictions for this procedure. Please do NOT wear cologne, perfume, aftershave, or lotions (deodorant is allowed). Please arrive 15 minutes prior to your appointment time. This procedure will be done at [address]     Follow-Up: At [facility], you and your health needs are our priority.  As part of our continuing mission to provide you with exceptional heart care, we have created designated Provider Care Teams.  These Care Teams include your primary Cardiologist (physician) and Advanced Practice Providers (APPs -  Physician Assistants and Nurse Practitioners) who all work together to provide you with the care you need, when you need it.  We recommend signing up for the patient portal called "MyChart".  Sign up information is provided on this After Visit Summary.  MyChart is used to connect with patients for Virtual Visits (Telemedicine).  Patients are able to view lab/test results, encounter notes, upcoming appointments, etc.  Non-urgent messages can be sent to your provider as well.   To learn more about what you can do with MyChart, go to [email or web address].    Your next appointment:   12 month(s)  Provider:   Quay Burow, MD    Other Instructions   Your cardiac CT will be scheduled at the below location:    [facility] [address] [place], [place] [address] [phone]  If scheduled at [facility], please arrive at the [facility] and [facility] Entrance (Entrance C2) of [facility] 30 minutes prior to test start time. You can use the FREE valet parking offered at entrance C (encouraged to control the heart rate for the test)  Proceed to the [facility] Radiology Department (first floor) to check-in and test prep.  All radiology patients and guests should use entrance C2 at [facility], accessed from [facility], even though the hospital's physical address listed is [address]     Please follow these instructions carefully (unless otherwise directed):   On the Night Before the Test: Be sure to Drink plenty of water. Do not consume any caffeinated/decaffeinated beverages or chocolate 12 hours prior to your test. Do not take any antihistamines 12 hours prior to your test.   On the Day of the Test: Drink plenty of water until 1 hour prior to the test. Do not eat any food 1 hour prior to test. You may take your regular medications prior to the test.  Take metoprolol (Lopressor)100mg  two hours prior to test. If you take Furosemide/Hydrochlorothiazide/Spironolactone, please HOLD on the morning of the test. FEMALES- please wear underwire-free bra if available, avoid dresses & tight clothing       After the Test: Drink plenty of water. After receiving IV contrast, you may experience a mild flushed feeling. This is normal. On occasion, you may experience a mild rash up to 24 hours after the test. This is not dangerous.  If this occurs, you can take Benadryl 25 mg and increase your fluid intake. If you experience trouble breathing, this can be serious. If it is severe call 911 IMMEDIATELY. If it is mild, please call our office. If you take any of these medications: Glipizide/Metformin, Avandament, Glucavance, please do not take  48 hours after completing test unless otherwise instructed.  We will call to schedule your test 2-4 weeks out understanding that some insurance companies will need an authorization prior to the service being performed.   For non-scheduling related questions, please contact the cardiac imaging nurse navigator should you have any questions/concerns: Marchia Bond, Cardiac Imaging Nurse Navigator Gordy Clement, Cardiac Imaging Nurse Navigator [facility] Direct Office Dial: [phone]   For scheduling needs, including cancellations and rescheduling, please call [place], [phone].

## 2022-12-03 NOTE — Assessment & Plan Note (Addendum)
History of ascending thoracic aortic aneurysm demonstrated by chest CT 11/28/2022 measuring 48 mm.  I am referring her to Dr. Cyndia Bent to be established.  She will need an imaging study on annual basis.  Blood pressures under good control.  I am going to begin her on a low-dose beta-blocker.

## 2022-12-03 NOTE — Progress Notes (Signed)
12/03/2022 Deanna Reeves   [birth year]  [id number]  Primary Physician Glenis Smoker, MD Primary Cardiologist: Lorretta Harp MD Deanna Reeves, [place]  HPI:  Deanna Reeves is a 72 y.o. 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  She is referred because of a CT scan performed 11/26/2022 that showed a 48 mm ascending thoracic aortic aneurysm.  She has a history of treated hypertension.  She had cardiac catheterization performed 01/10/2001 revealing no evidence of obstructive disease with presumptive coronary vasospasm.  She has a history of treated breast cancer and a left inguinal hernia repair.  Her father did have CAD.  She is never had a heart attack or stroke.  She does complain of occasional atypical chest pain and shortness of breath.   No outpatient medications have been marked as taking for the 12/03/22 encounter (Office Visit) with Lorretta Harp, MD.     Not on File  Social History   Socioeconomic History   Marital status: Divorced    Spouse name: Not on file   Number of children: Not on file   Years of education: Not on file   Highest education level: Not on file  Occupational History   Not on file  Tobacco Use   Smoking status: Former    Types: Cigarettes   Smokeless tobacco: Never  Substance and Sexual Activity   Alcohol use: Not Currently   Drug use: Never   Sexual activity: Yes  Other Topics Concern   Not on file  Social History Narrative   Not on file   Social Determinants of Health   Financial Resource Strain: Not on file  Food Insecurity: Not on file  Transportation Needs: Not on file  Physical Activity: Not on file  Stress: Not on file  Social Connections: Not on file  Intimate Partner Violence: Not on file     Review of  Systems: General: negative for chills, fever, night sweats or weight changes.  Cardiovascular: negative for chest pain, dyspnea on exertion, edema, orthopnea, palpitations, paroxysmal nocturnal dyspnea or shortness of breath Dermatological: negative for rash Respiratory: negative for cough or wheezing Urologic: negative for hematuria Abdominal: negative for nausea, vomiting, diarrhea, bright red blood per rectum, melena, or hematemesis Neurologic: negative for visual changes, syncope, or dizziness All other systems reviewed and are otherwise negative except as noted above.    Blood pressure 127/86, pulse 83, height 5\' 6"  (1.676 m), weight 154 lb 3.2 oz (69.9 kg), SpO2 99 %.  General appearance: alert and no distress Neck: no adenopathy, no carotid bruit, no JVD, supple, symmetrical, trachea midline, and thyroid not enlarged, symmetric, no tenderness/mass/nodules Lungs: clear to auscultation bilaterally Heart: regular rate and rhythm, S1, S2 normal, no murmur, click, rub or gallop Extremities: extremities normal, atraumatic, no cyanosis or edema Pulses: 2+ and symmetric Skin: Skin color, texture, turgor normal. No rashes or lesions Neurologic: Grossly normal  EKG sinus rhythm 83 with frequent frequent PACs.  I personally reviewed this EKG.   ASSESSMENT AND PLAN:   Thoracic aortic aneurysm ([place]) History of ascending thoracic aortic aneurysm demonstrated by chest CT 11/28/2022 measuring 48 mm.  I am referring her to Dr. Cyndia Bent to be established.  She will need an imaging study on annual basis.  Blood pressures under good control.  Essential hypertension History of essential hypertension blood pressure measured at 127/86.  She is on amlodipine and losartan.  Coronary vasospasm (HCC) Chronic catheterization performed by Dr. Melvern Banker 01/10/01 revealed no evidence of CAD with presumptive coronary vasospasm.     Lorretta Harp MD FACP,FACC,FAHA, [facility] 12/03/2022 10:19 AM

## 2022-12-03 NOTE — Assessment & Plan Note (Signed)
History of essential hypertension blood pressure measured at 127/86.  She is on amlodipine and losartan.

## 2022-12-03 NOTE — Assessment & Plan Note (Signed)
Chronic catheterization performed by Dr. Melvern Banker 01/10/01 revealed no evidence of CAD with presumptive coronary vasospasm.

## 2022-12-06 NOTE — Addendum Note (Signed)
Addended by: Wonda Horner on: 12/06/2022 11:53 AM   Modules accepted: Orders

## 2022-12-07 ENCOUNTER — Telehealth (HOSPITAL_COMMUNITY): Payer: Self-pay | Admitting: Emergency Medicine

## 2022-12-07 NOTE — Telephone Encounter (Signed)
Reaching out to patient to offer assistance regarding upcoming cardiac imaging study; pt verbalizes understanding of appt date/time, parking situation and where to check in, pre-test NPO status and medications ordered, and verified current allergies; name and call back number provided for further questions should they arise Marchia Bond RN Navigator Cardiac Imaging [facility] [phone] office [phone] cell  Arrival [address] entrance  Denies iv issues 100mg  metoprolol  Aware contrast/nitro

## 2022-12-09 ENCOUNTER — Ambulatory Visit (HOSPITAL_COMMUNITY)
Admission: RE | Admit: 2022-12-09 | Discharge: 2022-12-09 | Disposition: A | Discharge status: Home / Self Care | Payer: Medicare Other | Source: Ambulatory Visit | Attending: Cardiovascular Disease | Admitting: Cardiovascular Disease

## 2022-12-09 DIAGNOSIS — R072 Precordial pain: Secondary | ICD-10-CM | POA: Insufficient documentation

## 2022-12-09 MED ORDER — NITROGLYCERIN 0.4 MG SL SUBL
0.8000 mg | SUBLINGUAL_TABLET | Freq: Once | SUBLINGUAL | Status: AC
  Administered 2022-12-09: 0.8 mg via SUBLINGUAL

## 2022-12-09 MED ORDER — IOHEXOL 350 MG/ML SOLN
95.0000 mL | Freq: Once | INTRAVENOUS | Status: AC | PRN
  Administered 2022-12-09: 95 mL via INTRAVENOUS

## 2022-12-09 MED ORDER — NITROGLYCERIN 0.4 MG SL SUBL
SUBLINGUAL_TABLET | SUBLINGUAL | Status: AC
  Filled 2022-12-09: qty 2

## 2022-12-14 ENCOUNTER — Other Ambulatory Visit: Payer: Self-pay

## 2022-12-14 DIAGNOSIS — I7121 Aneurysm of the ascending aorta, without rupture: Secondary | ICD-10-CM

## 2022-12-27 ENCOUNTER — Ambulatory Visit (HOSPITAL_COMMUNITY): Payer: Medicare Other | Attending: Cardiology

## 2022-12-27 DIAGNOSIS — I10 Essential (primary) hypertension: Secondary | ICD-10-CM | POA: Diagnosis present

## 2022-12-27 DIAGNOSIS — I201 Angina pectoris with documented spasm: Secondary | ICD-10-CM

## 2022-12-27 DIAGNOSIS — Z1589 Genetic susceptibility to other disease: Secondary | ICD-10-CM

## 2022-12-27 DIAGNOSIS — R072 Precordial pain: Secondary | ICD-10-CM | POA: Diagnosis present

## 2022-12-27 DIAGNOSIS — I7121 Aneurysm of the ascending aorta, without rupture: Secondary | ICD-10-CM

## 2022-12-27 LAB — ECHOCARDIOGRAM COMPLETE
Area-P 1/2: 4.15 cm2
S' Lateral: 3.5 cm

## 2022-12-29 ENCOUNTER — Institutional Professional Consult (permissible substitution): Payer: Medicare Other | Admitting: Surgery

## 2022-12-29 ENCOUNTER — Encounter: Payer: Self-pay | Admitting: Surgery

## 2022-12-29 ENCOUNTER — Other Ambulatory Visit: Payer: Self-pay

## 2022-12-29 VITALS — BP 131/89 | HR 86 | Resp 18 | Ht 66.0 in | Wt 155.0 lb

## 2022-12-29 DIAGNOSIS — I7121 Aneurysm of the ascending aorta, without rupture: Secondary | ICD-10-CM

## 2022-12-29 DIAGNOSIS — E785 Hyperlipidemia, unspecified: Secondary | ICD-10-CM

## 2022-12-29 MED ORDER — ROSUVASTATIN CALCIUM 5 MG PO TABS: 5.0000 mg | ORAL_TABLET | ORAL | 3 refills | Status: AC

## 2022-12-30 NOTE — Progress Notes (Signed)
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  A CT scan of the chest with contrast was performed on 11/26/2022 which showed no abnormality around the right [place] joint but did show a 4.8 cm fusiform ascending aortic aneurysm.  She subsequently underwent a coronary CTA on 12/09/2022 showing a coronary calcium score of 0.  There were some noncalcified plaque causing mild stenosis in the mid RCA.  The ascending aorta was measured at 4.3 cm but the scan did not include the entire ascending aorta since it was focused on the heart.  She has a history of previous cardiac catheterization in 2002 for workup of chest discomfort.  There was no obstructive disease and a presumptive diagnosis of coronary vasospasm was made.  She has continued to have occasional atypical chest pain and shortness of breath.  There is no family history of connective tissue disorder, aortic aneurysm, or aortic dissection.   Past Medical History:  Diagnosis Date   Breast cancer 1999   Right Breast   Personal history of radiation therapy 1999   Right Breast    History reviewed.  Status post right breast lumpectomy and XRT in 1999 for breast cancer. History reviewed. No pertinent family history.  Social History Social History   Tobacco Use   Smoking status: Former    Types: Cigarettes   Smokeless tobacco: Never  Substance Use Topics   Alcohol use: Not Currently   Drug use: Never    Current Outpatient Medications  Medication Sig Dispense Refill   albuterol (VENTOLIN HFA) 108 (90 Base) MCG/ACT inhaler Inhale 2  puffs into the lungs every 4 (four) hours as needed for wheezing or shortness of breath.     amLODipine (NORVASC) 5 MG tablet Take 5 mg by mouth daily.     escitalopram (LEXAPRO) 20 MG tablet Take 20 mg by mouth daily.     estradiol (ESTRACE) 0.1 MG/GM vaginal cream Place 1 g vaginally 2 (two) times a week.     losartan (COZAAR) 50 MG tablet Take 50 mg by mouth daily.     methenamine (HIPREX) 1 g tablet Take 1 g by mouth daily.     metoprolol succinate (TOPROL XL) 25 MG 24 hr tablet Take 0.5 tablets (12.5 mg total) by mouth daily. 45 tablet 3   Probiotic Product (FORTIFY PROBIOTIC WOMENS PO) Take by mouth daily.     rosuvastatin (CRESTOR) 5 MG tablet Take 1 tablet (5 mg total) by mouth every other day. 45 tablet 3   VITAMIN D, CHOLECALCIFEROL, PO Take by mouth daily. Pt didn't know dosage     No current facility-administered medications for this visit.    Not on File  Review of Systems  Constitutional:  Negative for activity change and fatigue.  HENT: Negative.    Respiratory:         Occasional shortness of breath  Cardiovascular:        Occasional chest discomfort  Gastrointestinal: Negative.   Neurological:  Negative for dizziness and syncope.  Hematological: Negative.   Psychiatric/Behavioral: Negative.      BP 131/89 (BP Location:  Left Arm, Patient Position: Sitting)   Pulse 86   Resp 18   Ht 5\' 6"  (1.676 m)   Wt 155 lb (70.3 kg)   SpO2 98% Comment: RA  BMI 25.02 kg/m  Physical Exam Constitutional:      Appearance: Normal appearance. She is normal weight.  HENT:     Head: Normocephalic and atraumatic.  Eyes:     Extraocular Movements: Extraocular movements intact.     Pupils: Pupils are equal, round, and reactive to light.  Neck:     Vascular: No carotid bruit.  Cardiovascular:     Rate and Rhythm: Normal rate and regular rhythm.     Pulses: Normal pulses.     Heart sounds: Normal heart sounds. No murmur heard. Pulmonary:     Effort: Pulmonary effort is  normal.     Breath sounds: Normal breath sounds.  Musculoskeletal:        General: No swelling.  Skin:    General: Skin is warm and dry.  Neurological:     General: No focal deficit present.     Mental Status: She is alert and oriented to person, place, and time.  Psychiatric:        Mood and Affect: Mood normal.        Behavior: Behavior normal.      Diagnostic Tests:    ECHOCARDIOGRAM REPORT       Patient Name:   Deanna Reeves Date of Exam: 12/27/2022 Medical Rec #:  [id number]      Height:       66.0 in Accession #:    [id number]     Weight:       154.2 lb Date of Birth:  1950/12/07      BSA:          1.791 m Patient Age:    71 years       BP:           109/78 mmHg Patient Gender: F              HR:           67 bpm. Exam Location:  [address]  Procedure: 2D Echo, Cardiac Doppler, Color Doppler, 3D Echo and Strain Analysis  Indications:    Shortness of Breath R06.02   History:        Patient has no prior history of Echocardiogram examinations.   Sonographer:    Thurman Coyer RDCS Referring Phys: [phone] JONATHAN J BERRY  IMPRESSIONS    1. Left ventricular ejection fraction, by estimation, is 55 to 60%. Left ventricular ejection fraction by 3D volume is 55 %. The left ventricle has normal function. The left ventricle has no regional wall motion abnormalities. Left ventricular diastolic  parameters are indeterminate. The average left ventricular global longitudinal strain is -21.9 %. The global longitudinal strain is normal.  2. Right ventricular systolic function is normal. The right ventricular size is normal. There is normal pulmonary artery systolic pressure. The estimated right ventricular systolic pressure is 27.7 mmHg.  3. Left atrial size was moderately dilated.  4. The mitral valve is normal in structure. Mild mitral valve regurgitation. No evidence of mitral stenosis.  5. Tricuspid valve regurgitation is moderate.  6. The aortic valve is  tricuspid. Aortic valve regurgitation is trivial. No aortic stenosis is present.  7. Aortic dilatation noted. There is mild dilatation of the ascending aorta, measuring 44 mm.  8. The inferior vena cava is normal in size with  greater than 50% respiratory variability, suggesting right atrial pressure of 3 mmHg.  FINDINGS  Left Ventricle: Left ventricular ejection fraction, by estimation, is 55 to 60%. Left ventricular ejection fraction by 3D volume is 55 %. The left ventricle has normal function. The left ventricle has no regional wall motion abnormalities. The average left ventricular global longitudinal strain is -21.9 %. The global longitudinal strain is normal. The left ventricular internal cavity size was normal in size. There is no left ventricular hypertrophy. Left ventricular diastolic parameters are indeterminate.  Right Ventricle: The right ventricular size is normal. No increase in right ventricular wall thickness. Right ventricular systolic function is normal. There is normal pulmonary artery systolic pressure. The tricuspid regurgitant velocity is 2.22 m/s, and  with an assumed right atrial pressure of 8 mmHg, the estimated right ventricular systolic pressure is 27.7 mmHg.  Left Atrium: Left atrial size was moderately dilated.  Right Atrium: Right atrial size was normal in size.  Pericardium: There is no evidence of pericardial effusion.  Mitral Valve: The mitral valve is normal in structure. Mild mitral valve regurgitation. No evidence of mitral valve stenosis.  Tricuspid Valve: The tricuspid valve is normal in structure. Tricuspid valve regurgitation is moderate . No evidence of tricuspid stenosis.  Aortic Valve: The aortic valve is tricuspid. Aortic valve regurgitation is trivial. No aortic stenosis is present.  Pulmonic Valve: The pulmonic valve was normal in structure. Pulmonic valve regurgitation is not visualized. No evidence of pulmonic stenosis.  Aorta:  Aortic dilatation noted. There is mild dilatation of the ascending aorta, measuring 44 mm.  Venous: The inferior vena cava is normal in size with greater than 50% respiratory variability, suggesting right atrial pressure of 3 mmHg.  IAS/Shunts: No atrial level shunt detected by color flow Doppler.    LEFT VENTRICLE PLAX 2D LVIDd:         4.70 cm         Diastology LVIDs:         3.50 cm         LV e' medial:    6.09 cm/s LV PW:         1.00 cm         LV E/e' medial:  11.5 LV IVS:        1.00 cm         LV e' lateral:   8.45 cm/s LVOT diam:     2.50 cm         LV E/e' lateral: 8.3 LV SV:         91 LV SV Index:   51              2D LVOT Area:     4.91 cm        Longitudinal                                Strain                                2D Strain GLS  -21.9 %                                Avg:  3D Volume EF                                LV 3D EF:    Left                                             ventricul                                             ar                                             ejection                                             fraction                                             by 3D                                             volume is                                             55 %.                                  3D Volume EF:                                3D EF:        55 %                                LV EDV:       122 ml                                LV ESV:       55 ml                                LV SV:        67 ml  RIGHT VENTRICLE RV Basal diam:  2.60 cm RV Mid diam:    2.00 cm RV S prime:     14.60 cm/s TAPSE (M-mode): 3.1 cm  LEFT ATRIUM  Index        RIGHT ATRIUM           Index LA diam:        4.00 cm 2.23 cm/m   RA Area:     21.40 cm LA Vol (A2C):   79.4 ml 44.34 ml/m  RA Volume:   55.50 ml  31.00 ml/m LA Vol (A4C):   74.8 ml 41.77 ml/m LA Biplane Vol: 79.0 ml 44.12  ml/m  AORTIC VALVE LVOT Vmax:   87.80 cm/s LVOT Vmean:  55.700 cm/s LVOT VTI:    0.185 m   AORTA Ao Root diam: 3.90 cm Ao Asc diam:  4.40 cm  MITRAL VALVE               TRICUSPID VALVE MV Area (PHT): 4.15 cm    TR Peak grad:   19.7 mmHg MV Decel Time: 183 msec    TR Vmax:        222.00 cm/s MV E velocity: 70.30 cm/s MV A velocity: 68.60 cm/s  SHUNTS MV E/A ratio:  1.02        Systemic VTI:  0.18 m                            Systemic Diam: 2.50 cm  Donato Schultz MD Electronically signed by Donato Schultz MD Signature Date/Time: 12/27/2022/2:21:42 PM       Final      ADDENDUM REPORT: 12/05/2022 00:12   ADDENDUM: No abnormalities identified on CT in the right acromioclavicular area of concern. Consider further evaluation with ultrasound or MRI, if indicated.     Electronically Signed   By: Layla Maw M.D.   On: 12/05/2022 00:12    Addended by Domenic Polite, MD on 12/05/2022 12:14 AM    Study Result  Narrative & Impression  CLINICAL DATA:  Chest pain, SOB.   EXAM: CT CHEST WITH CONTRAST   TECHNIQUE: Multidetector CT imaging of the chest was performed during intravenous contrast administration.   RADIATION DOSE REDUCTION: This exam was performed according to the departmental dose-optimization program which includes automated exposure control, adjustment of the mA and/or kV according to patient size and/or use of iterative reconstruction technique.   CONTRAST:  75 mL OMNIPAQUE IOHEXOL 300 MG/ML  SOLN   COMPARISON:  None Available.   FINDINGS: Cardiovascular: Ascending thoracic aortic aneurysm identified with maximal diameter 4.8 cm. No dissection. No filling defects identified in pulmonary arteries to indicate PE. Normal heart size. No pericardial effusion.   Mediastinum/Nodes: No enlarged mediastinal, hilar, or axillary lymph nodes. Thyroid gland, trachea, and esophagus demonstrate no significant findings. Postop changes right axilla.    Lungs/Pleura: Lungs are clear. No pleural effusion or pneumothorax.   Upper Abdomen: Innumerable hepatic cysts identified measuring up to 8 cm in the right lobe.   Musculoskeletal: No chest wall abnormality. No acute or significant osseous findings.   IMPRESSION: 1. Ascending thoracic aortic aneurysm. 2. No PE or dissection identified. 3. Otherwise no acute cardiopulmonary process. 4. Hepatic cysts.   Electronically Signed: By: Layla Maw M.D. On: 11/28/2022 13:01      Impression:  This 72 year old woman has a 4.8 cm fusiform ascending aortic aneurysm with a 2.7 cm descending aorta at the same level.  Her echocardiogram shows a normal trileaflet aortic valve with trivial regurgitation.  Her aneurysm is well below the surgical threshold of 5.5 cm.  I reviewed the CT images with her and answered  all of her questions.  I stressed the importance of continued good blood pressure control in preventing further enlargement and acute aortic dissection.  I also advised her against doing any heavy lifting or strenuous activity that may require a Valsalva maneuver and could suddenly raise her blood pressure to high levels.   Plan:  I will see her back in 1 year with a CTA of the chest.  I spent 30 minutes performing this consultation and > 50% of this time was spent face to face counseling and coordinating the care of this patient's fusiform ascending aortic aneurysm.   Alleen BorneBryan K Joangel Vanosdol, MD [facility] [phone]

## 2023-01-05 ENCOUNTER — Ambulatory Visit (HOSPITAL_BASED_OUTPATIENT_CLINIC_OR_DEPARTMENT_OTHER): Payer: Federal, State, Local not specified - PPO | Admitting: Cardiology

## 2023-05-18 LAB — COMPREHENSIVE METABOLIC PANEL: EGFR: 92

## 2023-11-30 ENCOUNTER — Other Ambulatory Visit: Payer: Self-pay | Admitting: Surgery

## 2023-11-30 DIAGNOSIS — I7121 Aneurysm of the ascending aorta, without rupture: Secondary | ICD-10-CM

## 2023-12-14 ENCOUNTER — Ambulatory Visit
Admission: RE | Admit: 2023-12-14 | Discharge: 2023-12-14 | Disposition: A | Discharge status: Home / Self Care | Source: Ambulatory Visit | Attending: Surgery | Admitting: Surgery

## 2023-12-14 DIAGNOSIS — I7121 Aneurysm of the ascending aorta, without rupture: Secondary | ICD-10-CM

## 2023-12-14 MED ORDER — IOPAMIDOL (ISOVUE-370) INJECTION 76%
75.0000 mL | Freq: Once | INTRAVENOUS | Status: AC | PRN
  Administered 2023-12-14: 75 mL via INTRAVENOUS

## 2023-12-14 MED ORDER — IOPAMIDOL (ISOVUE-370) INJECTION 76%: 500.0000 mL | Freq: Once | INTRAVENOUS | Status: DC | PRN

## 2023-12-22 ENCOUNTER — Ambulatory Visit (INDEPENDENT_AMBULATORY_CARE_PROVIDER_SITE_OTHER): Admitting: Surgical

## 2023-12-22 VITALS — BP 120/79 | HR 74 | Resp 18 | Ht 66.0 in | Wt 152.0 lb

## 2023-12-22 DIAGNOSIS — I7121 Aneurysm of the ascending aorta, without rupture: Secondary | ICD-10-CM | POA: Diagnosis not present

## 2023-12-22 NOTE — Patient Instructions (Signed)
 Good control of blood pressure as discussed. Active lifestyle stressing the importance of exercise nutrition and other routine activities felt good for general health Avoiding fluoroquinolones

## 2023-12-22 NOTE — Progress Notes (Signed)
 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  She denies any recent symptoms of chest pain but does get some occasional posterior thoracic pain.  She is otherwise asymptomatic from a cardiology perspective.  She does have hypertension and has been under good control on current medication regimen she has no history of previous coronary artery disease and had a coronary CTA in March 2024 showing a calcium score of 0 and some noncalcified plaque causing mild stenosis in the RCA.  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  He has no significant family history of connective tissue disease, aortic aneurysm or aortic dissection.  She has a history of right breast cancer and has had radiation.   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   Social History   Occupational History   Not on file  Tobacco Use   Smoking status: Former    Types: Cigarettes   Smokeless tobacco: Never  Substance and Sexual Activity   Alcohol use: Not Currently   Drug use: Never   Sexual activity: Yes      Current Outpatient Medications on File Prior to Visit  Medication Sig Dispense Refill   albuterol (VENTOLIN HFA) 108 (90 Base) MCG/ACT inhaler Inhale 2 puffs into the lungs every 4 (four) hours as needed for wheezing or shortness of breath.     amLODipine (NORVASC) 5 MG tablet Take 5 mg by mouth daily.     escitalopram (LEXAPRO) 20 MG  tablet Take 20 mg by mouth daily.     losartan (COZAAR) 50 MG tablet Take 50 mg by mouth daily.     methenamine (HIPREX) 1 g tablet Take 1 g by mouth daily.     Probiotic Product (FORTIFY PROBIOTIC WOMENS PO) Take by mouth daily.     rosuvastatin (CRESTOR) 5 MG tablet Take 1 tablet (5 mg total) by mouth every other day. 45 tablet 3   estradiol (ESTRACE) 0.1 MG/GM vaginal cream Place 1 g vaginally 2 (two) times a week. (Patient not taking: Reported on 12/22/2023)     metoprolol succinate (TOPROL XL) 25 MG 24 hr tablet Take 0.5 tablets (12.5 mg total) by mouth daily. (Patient not taking: Reported on 12/22/2023) 45 tablet 3   VITAMIN D, CHOLECALCIFEROL, PO Take by mouth daily. Pt didn't know dosage (Patient not taking: Reported on 12/22/2023)     No current facility-administered medications on file prior to visit.     BP 120/79   Pulse 74   Resp 18   Ht 5\' 6"  (1.676 m)   Wt 152 lb (68.9 kg)   SpO2 97% Comment: RA  BMI 24.53 kg/m   Physical Exam Constitutional:      Appearance: Normal appearance. She is not ill-appearing.  HENT:     Head: Normocephalic and atraumatic.  Eyes:     General:  No scleral icterus. Neck:     Vascular: No carotid bruit.  Cardiovascular:     Pulses: Normal pulses.     Comments: Irregular rate and rhythm, no murmur Pulmonary:     Effort: Pulmonary effort is normal.     Breath sounds: Normal breath sounds.  Abdominal:     General: Abdomen is flat.     Palpations: Abdomen is soft.  Musculoskeletal:     Right lower leg: No edema.     Left lower leg: No edema.  Skin:    Coloration: Skin is not jaundiced or pale.  Neurological:     Mental Status: She is alert and oriented to person, place, and time.  Psychiatric:        Thought Content: Thought content normal.     CTA Results: Narrative & Impression  CLINICAL DATA:  Aneurysm of ascending thoracic aorta without rupture.   EXAM: CT ANGIOGRAPHY CHEST WITH CONTRAST   TECHNIQUE: Multidetector CT imaging  of the chest was performed using the standard protocol during bolus administration of intravenous contrast. Multiplanar CT image reconstructions and MIPs were obtained to evaluate the vascular anatomy.   RADIATION DOSE REDUCTION: This exam was performed according to the departmental dose-optimization program which includes automated exposure control, adjustment of the mA and/or kV according to patient size and/or use of iterative reconstruction technique.   CONTRAST:  75mL ISOVUE-370 IOPAMIDOL (ISOVUE-370) INJECTION 76%   COMPARISON:  CTA heart 12/09/2022 and and chest CT 11/26/2022   FINDINGS: Cardiovascular: Aortic root at the sinuses measures roughly 4.1 cm and stable. Pulsation artifact involving the ascending thoracic aorta. Ascending thoracic aorta measures approximately 4.5 cm and previously measured 4.4 cm. Negative for an aortic dissection. Typical three-vessel arch anatomy. Great vessels are patent. Proximal descending thoracic aorta measures 3.1 cm and stable. Normal caliber of the visualized abdominal aorta. Celiac trunk is patent. Proximal SMA and bilateral renal arteries are patent. Heart size is normal. No significant pericardial effusion.   Mediastinum/Nodes: No mediastinal or hilar lymph node enlargement. No axillary lymph node enlargement. Surgical clips in the right axilla. Visualized thyroid tissue is unremarkable.   Lungs/Pleura: Trachea and mainstem bronchi are patent. Stable nodular density along the medial aspect of the right lung on image 86/6 measures roughly 3 mm. No significant airspace disease or lung consolidation. No pleural effusions.   Upper Abdomen: Numerous hepatic cysts. Incomplete evaluation of the kidneys. No acute abnormality in the visualized upper abdomen.   Musculoskeletal: No acute bone abnormality.   Review of the MIP images confirms the above findings.   IMPRESSION: 1. Fusiform aneurysm of the ascending thoracic aorta measuring up  to 4.5 cm. Ascending thoracic aorta measured 4.4 cm on 12/09/2022. Study has some pulsation artifact and suspect minimal change since the previous examination. Negative for an aortic dissection. Ascending thoracic aortic aneurysm. Recommend semi-annual imaging followup by CTA or MRA. This recommendation follows 2010 ACCF/AHA/AATS/ACR/ASA/SCA/SCAI/SIR/STS/SVM Guidelines for the Diagnosis and Management of Patients With Thoracic Aortic Disease. Circulation. 2010; 121: W098-J191. Aortic aneurysm NOS (ICD10-I71.9) 2. No acute chest abnormality.     Electronically Signed   By: Richarda Overlie M.D.   On: 12/14/2023 15:23         ECHOCARDIOGRAM REPORT       Patient Name:   BLAKELYN DINGES Date of Exam: 12/27/2022 Medical Rec #:  [id number]      Height:       66.0 in Accession #:    [id number]     Weight:  154.2 lb Date of Birth:  [birth year]      BSA:          1.791 m Patient Age:    71 years       BP:           109/78 mmHg Patient Gender: F              HR:           67 bpm. Exam Location:  [address]  Procedure: 2D Echo, Cardiac Doppler, Color Doppler, 3D Echo and Strain Analysis  Indications:    Shortness of Breath R06.02   History:        Patient has no prior history of Echocardiogram examinations.   Sonographer:    Thurman Coyer RDCS Referring Phys: [phone] JONATHAN J BERRY  IMPRESSIONS    1. Left ventricular ejection fraction, by estimation, is 55 to 60%. Left ventricular ejection fraction by 3D volume is 55 %. The left ventricle has normal function. The left ventricle has no regional wall motion abnormalities. Left ventricular diastolic  parameters are indeterminate. The average left ventricular global longitudinal strain is -21.9 %. The global longitudinal strain is normal.  2. Right ventricular systolic function is normal. The right ventricular size is normal. There is normal pulmonary artery systolic pressure. The estimated right ventricular systolic pressure is  27.7 mmHg.  3. Left atrial size was moderately dilated.  4. The mitral valve is normal in structure. Mild mitral valve regurgitation. No evidence of mitral stenosis.  5. Tricuspid valve regurgitation is moderate.  6. The aortic valve is tricuspid. Aortic valve regurgitation is trivial. No aortic stenosis is present.  7. Aortic dilatation noted. There is mild dilatation of the ascending aorta, measuring 44 mm.  8. The inferior vena cava is normal in size with greater than 50% respiratory variability, suggesting right atrial pressure of 3 mmHg.  FINDINGS  Left Ventricle: Left ventricular ejection fraction, by estimation, is 55 to 60%. Left ventricular ejection fraction by 3D volume is 55 %. The left ventricle has normal function. The left ventricle has no regional wall motion abnormalities. The average left ventricular global longitudinal strain is -21.9 %. The global longitudinal strain is normal. The left ventricular internal cavity size was normal in size. There is no left ventricular hypertrophy. Left ventricular diastolic parameters are indeterminate.  Right Ventricle: The right ventricular size is normal. No increase in right ventricular wall thickness. Right ventricular systolic function is normal. There is normal pulmonary artery systolic pressure. The tricuspid regurgitant velocity is 2.22 m/s, and  with an assumed right atrial pressure of 8 mmHg, the estimated right ventricular systolic pressure is 27.7 mmHg.  Left Atrium: Left atrial size was moderately dilated.  Right Atrium: Right atrial size was normal in size.  Pericardium: There is no evidence of pericardial effusion.  Mitral Valve: The mitral valve is normal in structure. Mild mitral valve regurgitation. No evidence of mitral valve stenosis.  Tricuspid Valve: The tricuspid valve is normal in structure. Tricuspid valve regurgitation is moderate . No evidence of tricuspid stenosis.  Aortic Valve: The aortic  valve is tricuspid. Aortic valve regurgitation is trivial. No aortic stenosis is present.  Pulmonic Valve: The pulmonic valve was normal in structure. Pulmonic valve regurgitation is not visualized. No evidence of pulmonic stenosis.  Aorta: Aortic dilatation noted. There is mild dilatation of the ascending aorta, measuring 44 mm.  Venous: The inferior vena cava is normal in size with greater than 50% respiratory variability, suggesting right  atrial pressure of 3 mmHg.  IAS/Shunts: No atrial level shunt detected by color flow Doppler.    LEFT VENTRICLE PLAX 2D LVIDd:         4.70 cm         Diastology LVIDs:         3.50 cm         LV e' medial:    6.09 cm/s LV PW:         1.00 cm         LV E/e' medial:  11.5 LV IVS:        1.00 cm         LV e' lateral:   8.45 cm/s LVOT diam:     2.50 cm         LV E/e' lateral: 8.3 LV SV:         91 LV SV Index:   51              2D LVOT Area:     4.91 cm        Longitudinal                                Strain                                2D Strain GLS  -21.9 %                                Avg:                                  3D Volume EF                                LV 3D EF:    Left                                             ventricul                                             ar                                             ejection                                             fraction                                             by 3D  volume is                                             55 %.                                  3D Volume EF:                                3D EF:        55 %                                LV EDV:       122 ml                                LV ESV:       55 ml                                LV SV:        67 ml  RIGHT VENTRICLE RV Basal diam:  2.60 cm RV Mid diam:    2.00 cm RV S prime:     14.60 cm/s TAPSE (M-mode): 3.1 cm  LEFT ATRIUM              Index        RIGHT ATRIUM           Index LA diam:        4.00 cm 2.23 cm/m   RA Area:     21.40 cm LA Vol (A2C):   79.4 ml 44.34 ml/m  RA Volume:   55.50 ml  31.00 ml/m LA Vol (A4C):   74.8 ml 41.77 ml/m LA Biplane Vol: 79.0 ml 44.12 ml/m  AORTIC VALVE LVOT Vmax:   87.80 cm/s LVOT Vmean:  55.700 cm/s LVOT VTI:    0.185 m   AORTA Ao Root diam: 3.90 cm Ao Asc diam:  4.40 cm  MITRAL VALVE               TRICUSPID VALVE MV Area (PHT): 4.15 cm    TR Peak grad:   19.7 mmHg MV Decel Time: 183 msec    TR Vmax:        222.00 cm/s MV E velocity: 70.30 cm/s MV A velocity: 68.60 cm/s  SHUNTS MV E/A ratio:  1.02        Systemic VTI:  0.18 m                            Systemic Diam: 2.50 cm  Donato Schultz MD Electronically signed by Donato Schultz MD Signature Date/Time: 12/27/2022/2:21:42 PM       A/P: 4.5 cm ascending thoracic aortic aneurysm was is very stable.  She has a good knowledge base as it relates to the disease process.  Her blood pressure has been under good control on current medication regimen.  She understands that current measurements the recommendations are scans at 3-month intervals.  She does not wish to  do that at this time and is requesting to have the CTA done in 2 years.  She understands the risks associated with not knowing whether has grown during that time interval.  As she has known frequent UTIs I did emphasize that she should not use fluoroquinolone antibiotics and she understands this.      Risk Modification: Discussed routine lifestyle modifications in terms of overall general health.  Statin: She is on low-dose Crestor  Smoking cessation instruction/counseling given: Not applicable  Patient was counseled on importance of Blood Pressure Control.  Despite Medical intervention if the patient notices persistently elevated blood pressure readings.  They are instructed to contact their Primary Care Physician  Please avoid use of Fluoroquinolones as this can  potentially increase your risk of Aortic Rupture and/or Dissection  Patient educated on signs and symptoms of Aortic Dissection, handout also provided in AVS  Rowe Clack, PA-C 12/22/23

## 2024-09-07 ENCOUNTER — Encounter (INDEPENDENT_AMBULATORY_CARE_PROVIDER_SITE_OTHER): Payer: Self-pay

## 2024-10-23 ENCOUNTER — Institutional Professional Consult (permissible substitution) (INDEPENDENT_AMBULATORY_CARE_PROVIDER_SITE_OTHER): Admitting: Otolaryngology

## 2024-10-24 ENCOUNTER — Ambulatory Visit (INDEPENDENT_AMBULATORY_CARE_PROVIDER_SITE_OTHER): Admitting: Otolaryngology

## 2024-10-24 ENCOUNTER — Encounter (INDEPENDENT_AMBULATORY_CARE_PROVIDER_SITE_OTHER): Payer: Self-pay | Admitting: Otolaryngology

## 2024-10-24 VITALS — BP 131/79 | HR 70 | Ht 66.5 in | Wt 150.0 lb

## 2024-10-24 DIAGNOSIS — Z87891 Personal history of nicotine dependence: Secondary | ICD-10-CM | POA: Diagnosis not present

## 2024-10-24 DIAGNOSIS — K1120 Sialoadenitis, unspecified: Secondary | ICD-10-CM | POA: Diagnosis not present

## 2024-10-24 NOTE — Progress Notes (Signed)
 Reason for Consult: Sialadenitis Referring Physician: Dr. Chrystal Rojelio DELENA Deanna Reeves is an 74 y.o. female.  HPI: History of 6 episodes of sialadenitis and various salivary glands since 2020.  She had the most recent episode approximately a month ago in the left submandibular gland.  It was painful.  There was swelling in the floor mouth.  It resolved on its own.  She has never had antibiotics for these episodes.  She does take multiple medications that do dry her out.  She was on antihistamines which she did stop.  She admits she can be dehydrated at times.  She also has undergoing a autoimmune workup and she has elevated ANA and sees rheumatology.  She has no known stones of the glands.  Each of the 6 episodes has been mostly a different gland but she cannot exactly remember which ones they were.  Past Medical History:  Diagnosis Date   Breast cancer [facility]) 1999   Right Breast   Personal history of radiation therapy 1999   Right Breast    History reviewed. No pertinent surgical history.  History reviewed. No pertinent family history.  Social History:  reports that she has quit smoking. Her smoking use included cigarettes. She has never used smokeless tobacco. She reports that she does not currently use alcohol. She reports that she does not use drugs.  Allergies: Allergies[1]   No results found for this or any previous visit (from the past 48 hours).  No results found.  ROS Blood pressure 131/79, pulse 70, height 5' 6.5 (1.689 m), weight 150 lb (68 kg), SpO2 97%. Physical Exam Constitutional:      Appearance: Normal appearance.  HENT:     Head: Normocephalic and atraumatic.     Right Ear: Tympanic membrane is without lesions and middle ear aerated, ear canal and external ear normal.     Left Ear: Tympanic membrane is without lesions and middle ear aerated, ear canal and external ear normal.     Nose: Nose without deviation of septum.  Turbinates with mild hypertrophy, No  significant swelling or masses.     Oral cavity/oropharynx: Stensen's and Wharton's ducts all look normal.  There is no identified stones by visualization or palpation mucous membranes are moist. No lesions or masses    Larynx: normal voice. Mirror attempted without success    Eyes:     Extraocular Movements: Extraocular movements intact.     Conjunctiva/sclera: Conjunctivae normal.     Pupils: Pupils are equal, round, and reactive to light.  Cardiovascular:     Rate and Rhythm: Normal rate.  Pulmonary:     Effort: Pulmonary effort is normal.  Musculoskeletal:     Cervical back: Normal range of motion and neck supple. No rigidity.  Lymphadenopathy:     Cervical: No cervical adenopathy or masses.salivary glands without lesions. .     Salivary glands-the left submandibular gland is very slightly larger than the right.  Neurological:     Mental Status: He is alert. CN 2-12 intact. No nystagmus      Assessment/Plan: Sialadenitis-most likely this is secondary to dryness or her autoimmune disorder.  I do not have any intervention that would be appropriate.  We talked about radiographic studies to see if she has stones but she does not have enough episodes at this point to really justify any intervention.  She should hydrate well and she will follow-up with me as needed.  Deanna Reeves 10/24/2024, 10:43 AM        [  1] Not on File

## 2024-11-19 ENCOUNTER — Ambulatory Visit: Admitting: Physician Assistant

## 2024-11-23 ENCOUNTER — Ambulatory Visit (INDEPENDENT_AMBULATORY_CARE_PROVIDER_SITE_OTHER): Admitting: Audiology
